# Patient Record
Sex: Male | Born: 1985 | Race: White | Hispanic: No | Marital: Single | Smoking: Current every day smoker
Health system: Southern US, Community
[De-identification: ages and names within clinical notes are randomized; demographics above are authoritative.]

## PROBLEM LIST (undated history)

## (undated) DIAGNOSIS — N200 Calculus of kidney: Secondary | ICD-10-CM

## (undated) HISTORY — PX: FINGER SURGERY: SHX640

---

## 2012-09-25 ENCOUNTER — Encounter (HOSPITAL_COMMUNITY): Payer: Self-pay | Admitting: Pharmacy Technician

## 2012-09-25 ENCOUNTER — Encounter (HOSPITAL_COMMUNITY): Payer: Self-pay | Admitting: *Deleted

## 2012-09-26 ENCOUNTER — Encounter (HOSPITAL_COMMUNITY): Payer: Self-pay | Admitting: Certified Registered Nurse Anesthetist

## 2012-09-26 ENCOUNTER — Encounter (HOSPITAL_COMMUNITY)
Admission: RE | Disposition: A | Discharge status: Home / Self Care | Payer: Self-pay | Source: Ambulatory Visit | Attending: Orthopedic Surgery

## 2012-09-26 ENCOUNTER — Ambulatory Visit (HOSPITAL_COMMUNITY)
Admission: RE | Admit: 2012-09-26 | Discharge: 2012-09-26 | Disposition: A | Discharge status: Home / Self Care | Payer: BC Managed Care – PPO | Source: Ambulatory Visit | Attending: Orthopedic Surgery | Admitting: Orthopedic Surgery

## 2012-09-26 ENCOUNTER — Encounter (HOSPITAL_COMMUNITY): Payer: Self-pay | Admitting: *Deleted

## 2012-09-26 DIAGNOSIS — Z538 Procedure and treatment not carried out for other reasons: Secondary | ICD-10-CM | POA: Insufficient documentation

## 2012-09-26 DIAGNOSIS — S62309A Unspecified fracture of unspecified metacarpal bone, initial encounter for closed fracture: Secondary | ICD-10-CM | POA: Insufficient documentation

## 2012-09-26 DIAGNOSIS — X58XXXA Exposure to other specified factors, initial encounter: Secondary | ICD-10-CM | POA: Insufficient documentation

## 2012-09-26 HISTORY — DX: Calculus of kidney: N20.0

## 2012-09-26 LAB — SURGICAL PCR SCREEN
MRSA, PCR: NEGATIVE
Staphylococcus aureus: POSITIVE — AB

## 2012-09-26 LAB — CBC
MCH: 33.3 pg (ref 26.0–34.0)
Platelets: 218 10*3/uL (ref 150–400)
RBC: 4.65 MIL/uL (ref 4.22–5.81)

## 2012-09-26 SURGERY — CANCELLED PROCEDURE

## 2012-09-26 MED ORDER — MUPIROCIN 2 % EX OINT
TOPICAL_OINTMENT | Freq: Two times a day (BID) | CUTANEOUS | Status: DC
  Filled 2012-09-26: qty 22

## 2012-09-26 SURGICAL SUPPLY — 31 items
APL SKNCLS STERI-STRIP NONHPOA (GAUZE/BANDAGES/DRESSINGS)
BANDAGE ELASTIC 3 VELCRO ST LF (GAUZE/BANDAGES/DRESSINGS) ×3 IMPLANT
BANDAGE ELASTIC 4 VELCRO ST LF (GAUZE/BANDAGES/DRESSINGS) IMPLANT
BANDAGE GAUZE ELAST BULKY 4 IN (GAUZE/BANDAGES/DRESSINGS) ×3 IMPLANT
BENZOIN TINCTURE PRP APPL 2/3 (GAUZE/BANDAGES/DRESSINGS) IMPLANT
BLADE SURG ROTATE 9660 (MISCELLANEOUS) IMPLANT
CLOTH BEACON ORANGE TIMEOUT ST (SAFETY) ×3 IMPLANT
COVER SURGICAL LIGHT HANDLE (MISCELLANEOUS) ×3 IMPLANT
CUFF TOURNIQUET SINGLE 18IN (TOURNIQUET CUFF) IMPLANT
CUFF TOURNIQUET SINGLE 24IN (TOURNIQUET CUFF) IMPLANT
DRSG EMULSION OIL 3X3 NADH (GAUZE/BANDAGES/DRESSINGS) IMPLANT
GAUZE XEROFORM 1X8 LF (GAUZE/BANDAGES/DRESSINGS) IMPLANT
GLOVE BIOGEL PI IND STRL 8.5 (GLOVE) ×2 IMPLANT
GLOVE BIOGEL PI INDICATOR 8.5 (GLOVE) ×1
GLOVE SURG ORTHO 8.0 STRL STRW (GLOVE) ×3 IMPLANT
GOWN STRL NON-REIN LRG LVL3 (GOWN DISPOSABLE) ×6 IMPLANT
KIT BASIN OR (CUSTOM PROCEDURE TRAY) ×3 IMPLANT
KIT ROOM TURNOVER OR (KITS) ×3 IMPLANT
MANIFOLD NEPTUNE II (INSTRUMENTS) ×3 IMPLANT
NS IRRIG 1000ML POUR BTL (IV SOLUTION) ×3 IMPLANT
PACK ORTHO EXTREMITY (CUSTOM PROCEDURE TRAY) ×3 IMPLANT
PAD ARMBOARD 7.5X6 YLW CONV (MISCELLANEOUS) ×6 IMPLANT
SPONGE GAUZE 4X4 12PLY (GAUZE/BANDAGES/DRESSINGS) IMPLANT
STRIP CLOSURE SKIN 1/2X4 (GAUZE/BANDAGES/DRESSINGS) IMPLANT
SUT ETHILON 4 0 P 3 18 (SUTURE) IMPLANT
SUT ETHILON 5 0 P 3 18 (SUTURE)
SUT NYLON ETHILON 5-0 P-3 1X18 (SUTURE) IMPLANT
SUT PROLENE 4 0 P 3 18 (SUTURE) IMPLANT
TOWEL OR 17X24 6PK STRL BLUE (TOWEL DISPOSABLE) ×3 IMPLANT
TOWEL OR 17X26 10 PK STRL BLUE (TOWEL DISPOSABLE) ×3 IMPLANT
WATER STERILE IRR 1000ML POUR (IV SOLUTION) ×3 IMPLANT

## 2012-09-26 NOTE — Preoperative (Signed)
Beta Blockers   Reason not to administer Beta Blockers:Not Applicable 

## 2012-09-26 NOTE — Progress Notes (Signed)
Dr. Melvyn Novas called and spoke with pt.  Pt's surgery is rescheduled for tomorrow afternoon.  Dr. Melvyn Novas to call pt with a time.  Pt instructed to be NPO after midnight.  Instructed to not eat or drink unless Dr. Melvyn Novas tells him otherwise when he calls with his new surgery time.  Pt and friend verbalize understanding and deny further questions.  Pt labwork and PCR are pending.  Pre admit complete.

## 2013-06-11 ENCOUNTER — Other Ambulatory Visit: Payer: Self-pay | Admitting: General Practice

## 2013-06-11 ENCOUNTER — Ambulatory Visit
Admission: RE | Admit: 2013-06-11 | Discharge: 2013-06-11 | Disposition: A | Discharge status: Home / Self Care | Payer: No Typology Code available for payment source | Source: Ambulatory Visit | Attending: General Practice | Admitting: General Practice

## 2013-06-11 DIAGNOSIS — R7611 Nonspecific reaction to tuberculin skin test without active tuberculosis: Secondary | ICD-10-CM

## 2014-02-25 ENCOUNTER — Encounter (HOSPITAL_COMMUNITY): Payer: Self-pay | Admitting: Emergency Medicine

## 2014-02-25 ENCOUNTER — Emergency Department (HOSPITAL_COMMUNITY)
Admission: EM | Admit: 2014-02-25 | Discharge: 2014-02-25 | Disposition: A | Discharge status: Home / Self Care | Payer: Self-pay | Attending: Emergency Medicine | Admitting: Emergency Medicine

## 2014-02-25 ENCOUNTER — Emergency Department (HOSPITAL_COMMUNITY): Payer: Self-pay

## 2014-02-25 DIAGNOSIS — Z79899 Other long term (current) drug therapy: Secondary | ICD-10-CM | POA: Insufficient documentation

## 2014-02-25 DIAGNOSIS — N201 Calculus of ureter: Secondary | ICD-10-CM | POA: Insufficient documentation

## 2014-02-25 DIAGNOSIS — N23 Unspecified renal colic: Secondary | ICD-10-CM | POA: Insufficient documentation

## 2014-02-25 DIAGNOSIS — Z7982 Long term (current) use of aspirin: Secondary | ICD-10-CM | POA: Insufficient documentation

## 2014-02-25 DIAGNOSIS — F17200 Nicotine dependence, unspecified, uncomplicated: Secondary | ICD-10-CM | POA: Insufficient documentation

## 2014-02-25 LAB — URINE MICROSCOPIC-ADD ON

## 2014-02-25 LAB — CBC WITH DIFFERENTIAL/PLATELET
BASOS PCT: 1 % (ref 0–1)
Basophils Absolute: 0 10*3/uL (ref 0.0–0.1)
EOS ABS: 0.2 10*3/uL (ref 0.0–0.7)
EOS PCT: 2 % (ref 0–5)
HCT: 41.6 % (ref 39.0–52.0)
HEMOGLOBIN: 14.5 g/dL (ref 13.0–17.0)
Lymphocytes Relative: 14 % (ref 12–46)
Lymphs Abs: 1.1 10*3/uL (ref 0.7–4.0)
MCH: 33.6 pg (ref 26.0–34.0)
MCHC: 34.9 g/dL (ref 30.0–36.0)
MCV: 96.5 fL (ref 78.0–100.0)
MONOS PCT: 8 % (ref 3–12)
Monocytes Absolute: 0.6 10*3/uL (ref 0.1–1.0)
NEUTROS PCT: 75 % (ref 43–77)
Neutro Abs: 5.9 10*3/uL (ref 1.7–7.7)
Platelets: 205 10*3/uL (ref 150–400)
RBC: 4.31 MIL/uL (ref 4.22–5.81)
RDW: 12 % (ref 11.5–15.5)
WBC: 7.8 10*3/uL (ref 4.0–10.5)

## 2014-02-25 LAB — COMPREHENSIVE METABOLIC PANEL
ALBUMIN: 4 g/dL (ref 3.5–5.2)
ALT: 22 U/L (ref 0–53)
AST: 27 U/L (ref 0–37)
Alkaline Phosphatase: 42 U/L (ref 39–117)
BUN: 11 mg/dL (ref 6–23)
CALCIUM: 9.1 mg/dL (ref 8.4–10.5)
CO2: 24 mEq/L (ref 19–32)
CREATININE: 1.08 mg/dL (ref 0.50–1.35)
Chloride: 105 mEq/L (ref 96–112)
GFR calc non Af Amer: >90 mL/min (ref >90)
GLUCOSE: 93 mg/dL (ref 70–99)
POTASSIUM: 4.1 meq/L (ref 3.7–5.3)
Sodium: 141 mEq/L (ref 137–147)
TOTAL PROTEIN: 6.9 g/dL (ref 6.0–8.3)
Total Bilirubin: 0.5 mg/dL (ref 0.3–1.2)

## 2014-02-25 LAB — URINALYSIS, ROUTINE W REFLEX MICROSCOPIC
Bilirubin Urine: NEGATIVE
GLUCOSE, UA: NEGATIVE mg/dL
Ketones, ur: NEGATIVE mg/dL
NITRITE: NEGATIVE
PH: 7.5 (ref 5.0–8.0)
Protein, ur: NEGATIVE mg/dL
SPECIFIC GRAVITY, URINE: 1.009 (ref 1.005–1.030)
Urobilinogen, UA: 0.2 mg/dL (ref 0.0–1.0)

## 2014-02-25 MED ORDER — IBUPROFEN 400 MG PO TABS
400.0000 mg | ORAL_TABLET | Freq: Once | ORAL | Status: AC
  Administered 2014-02-25: 400 mg via ORAL
  Filled 2014-02-25: qty 1

## 2014-02-25 MED ORDER — ONDANSETRON HCL 4 MG PO TABS: 4.0000 mg | ORAL_TABLET | Freq: Three times a day (TID) | ORAL | Status: DC | PRN

## 2014-02-25 MED ORDER — NAPROXEN 250 MG PO TABS: 250.0000 mg | ORAL_TABLET | Freq: Two times a day (BID) | ORAL | Status: DC

## 2014-02-25 MED ORDER — OXYCODONE-ACETAMINOPHEN 5-325 MG PO TABS: ORAL_TABLET | ORAL | Status: DC

## 2014-02-25 MED ORDER — OXYCODONE-ACETAMINOPHEN 5-325 MG PO TABS
2.0000 | ORAL_TABLET | Freq: Once | ORAL | Status: AC
  Administered 2014-02-25: 2 via ORAL
  Filled 2014-02-25: qty 2

## 2014-02-25 MED ORDER — ONDANSETRON 4 MG PO TBDP
8.0000 mg | ORAL_TABLET | Freq: Once | ORAL | Status: AC
  Administered 2014-02-25: 8 mg via ORAL
  Filled 2014-02-25: qty 2

## 2014-02-25 MED ORDER — TAMSULOSIN HCL 0.4 MG PO CAPS
0.4000 mg | ORAL_CAPSULE | Freq: Every day | ORAL | Status: DC
Start: 2014-02-25 — End: 2015-03-24

## 2014-02-25 NOTE — ED Notes (Signed)
Hes had R sided flank pain since waking this am. He states "it feels like when i had a kidney stone." denies n/v.

## 2014-02-25 NOTE — ED Notes (Signed)
Patient c/o right sided flank pain constant with intermittent flare-ups. Pt sts typical of pt. Frequent kidney stone pain. Patient sts one episode of hematuria and severe dsyuria this morning- pt denies seeing kidney stone. Patient ambulatory without guarding. Pt breathing equal and unlabored. Patient non-tender abdomen- denies n/v/d

## 2014-02-25 NOTE — ED Provider Notes (Signed)
CSN: [id number]     Arrival date & time 02/25/14  1035 History   First MD Initiated Contact with Patient 02/25/14 1220     Chief Complaint  Patient presents with  . Flank Pain      HPI Pt was seen at 1230. Per pt, c/o sudden onset and persistence of waxing and waning right sided flank "pain" that began 5 days ago.  Pt describes the pain as "like my last kidney stone" 5 months ago. Has been associated with multiple intermittent episodes of N/V.  Denies testicular pain/swelling, no dysuria/hematuria, no abd pain, no diarrhea, no black or blood in emesis, no CP/SOB.     Past Medical History  Diagnosis Date  . Kidney stone    Past Surgical History  Procedure Laterality Date  . Finger surgery      Right Index finger- reconnect tendons    History  Substance Use Topics  . Smoking status: Current Every Day Smoker -- 1.00 packs/day for 8 years    Types: Cigarettes  . Smokeless tobacco: Not on file  . Alcohol Use: 1.2 oz/week    2 Cans of beer per week    Review of Systems ROS: Statement: All systems negative except as marked or noted in the HPI; Constitutional: Negative for fever and chills. ; ; Eyes: Negative for eye pain, redness and discharge. ; ; ENMT: Negative for ear pain, hoarseness, nasal congestion, sinus pressure and sore throat. ; ; Cardiovascular: Negative for chest pain, palpitations, diaphoresis, dyspnea and peripheral edema. ; ; Respiratory: Negative for cough, wheezing and stridor. ; ; Gastrointestinal: +N/V. Negative for diarrhea, abdominal pain, blood in stool, hematemesis, jaundice and rectal bleeding. . ; ; Genitourinary: +right flank pain. Negative for dysuria and hematuria. ; ; Genital:  No penile drainage or rash, no testicular pain or swelling, no scrotal rash or swelling.;;  Musculoskeletal: Negative for back pain and neck pain. Negative for swelling and trauma.; ; Skin: Negative for pruritus, rash, abrasions, blisters, bruising and skin lesion.; ; Neuro: Negative  for headache, lightheadedness and neck stiffness. Negative for weakness, altered level of consciousness , altered mental status, extremity weakness, paresthesias, involuntary movement, seizure and syncope.      Allergies  Endocet  Home Medications   Prior to Admission medications   Medication Sig Start Date End Date Taking? Authorizing Provider  aspirin 325 MG tablet Take 925 mg by mouth every 6 (six) hours as needed for moderate pain.   Yes Historical Provider, MD  B Complex-C (B-COMPLEX WITH VITAMIN C) tablet Take 1 tablet by mouth daily.   Yes Historical Provider, MD  oxyCODONE-acetaminophen (PERCOCET) 10-325 MG per tablet Take 1 tablet by mouth every 4 (four) hours as needed for pain.    Yes Historical Provider, MD   BP 127/79  Pulse 63  Temp(Src) 98.1 F (36.7 C) (Oral)  Resp 16  Ht 6\' 6"  (1.981 m)  Wt 220 lb (99.791 kg)  BMI 25.43 kg/m2  SpO2 100% Physical Exam 1235: Physical examination:  Nursing notes reviewed; Vital signs and O2 SAT reviewed;  Constitutional: Well developed, Well nourished, Well hydrated, In no acute distress; Head:  Normocephalic, atraumatic; Eyes: EOMI, PERRL, No scleral icterus; ENMT: Mouth and pharynx normal, Mucous membranes moist; Neck: Supple, Full range of motion, No lymphadenopathy; Cardiovascular: Regular rate and rhythm, No murmur, rub, or gallop; Respiratory: Breath sounds clear & equal bilaterally, No rales, rhonchi, wheezes.  Speaking full sentences with ease, Normal respiratory effort/excursion; Chest: Nontender, Movement normal; Abdomen: Soft, Nontender, Nondistended,  Normal bowel sounds; Genitourinary: No CVA tenderness; Spine:  No midline CS, TS, LS tenderness. +TTP right lumbar paraspinal muscles. No rash;; Extremities: Pulses normal, No tenderness, No edema, No calf edema or asymmetry.; Neuro: AA&Ox3, Major CN grossly intact.  Speech clear. No gross focal motor or sensory deficits in extremities.; Skin: Color normal, Warm, Dry.   ED Course   Procedures     EKG Interpretation None      MDM  MDM Reviewed: previous chart, nursing note and vitals Interpretation: labs and CT scan   Results for orders placed during the hospital encounter of 02/25/14  CBC WITH DIFFERENTIAL      Result Value Ref Range   WBC 7.8  4.0 - 10.5 K/uL   RBC 4.31  4.22 - 5.81 MIL/uL   Hemoglobin 14.5  13.0 - 17.0 g/dL   HCT 16.1  09.6 - 04.5 %   MCV 96.5  78.0 - 100.0 fL   MCH 33.6  26.0 - 34.0 pg   MCHC 34.9  30.0 - 36.0 g/dL   RDW 40.9  81.1 - 91.4 %   Platelets 205  150 - 400 K/uL   Neutrophils Relative % 75  43 - 77 %   Neutro Abs 5.9  1.7 - 7.7 K/uL   Lymphocytes Relative 14  12 - 46 %   Lymphs Abs 1.1  0.7 - 4.0 K/uL   Monocytes Relative 8  3 - 12 %   Monocytes Absolute 0.6  0.1 - 1.0 K/uL   Eosinophils Relative 2  0 - 5 %   Eosinophils Absolute 0.2  0.0 - 0.7 K/uL   Basophils Relative 1  0 - 1 %   Basophils Absolute 0.0  0.0 - 0.1 K/uL  COMPREHENSIVE METABOLIC PANEL      Result Value Ref Range   Sodium 141  137 - 147 mEq/L   Potassium 4.1  3.7 - 5.3 mEq/L   Chloride 105  96 - 112 mEq/L   CO2 24  19 - 32 mEq/L   Glucose, Bld 93  70 - 99 mg/dL   BUN 11  6 - 23 mg/dL   Creatinine, Ser 7.82  0.50 - 1.35 mg/dL   Calcium 9.1  8.4 - 95.6 mg/dL   Total Protein 6.9  6.0 - 8.3 g/dL   Albumin 4.0  3.5 - 5.2 g/dL   AST 27  0 - 37 U/L   ALT 22  0 - 53 U/L   Alkaline Phosphatase 42  39 - 117 U/L   Total Bilirubin 0.5  0.3 - 1.2 mg/dL   GFR calc non Af Amer >90  >90 mL/min   GFR calc Af Amer >90  >90 mL/min  URINALYSIS, ROUTINE W REFLEX MICROSCOPIC      Result Value Ref Range   Color, Urine YELLOW  YELLOW   APPearance CLEAR  CLEAR   Specific Gravity, Urine 1.009  1.005 - 1.030   pH 7.5  5.0 - 8.0   Glucose, UA NEGATIVE  NEGATIVE mg/dL   Hgb urine dipstick LARGE (*) NEGATIVE   Bilirubin Urine NEGATIVE  NEGATIVE   Ketones, ur NEGATIVE  NEGATIVE mg/dL   Protein, ur NEGATIVE  NEGATIVE mg/dL   Urobilinogen, UA 0.2  0.0 - 1.0 mg/dL    Nitrite NEGATIVE  NEGATIVE   Leukocytes, UA TRACE (*) NEGATIVE  URINE MICROSCOPIC-ADD ON      Result Value Ref Range   WBC, UA 0-2  <3 WBC/hpf   RBC / HPF 21-50  <  3 RBC/hpf   Ct Abdomen Pelvis Wo Contrast 02/25/2014   CLINICAL DATA:  Right flank pain; history of urinary tract stones  EXAM: CT ABDOMEN AND PELVIS WITHOUT CONTRAST  TECHNIQUE: Multidetector CT imaging of the abdomen and pelvis was performed following the standard protocol without IV contrast.  COMPARISON:  CT scan of the abdomen and pelvis dated October 12, 2013.  FINDINGS: There is mild right-sided hydronephrosis with mild proximal hydroureter. There is a new 3 mm diameter calcified stone in the right distal ureter just proximal to the ureterovesical junction. This is demonstrated on image 82 of series 2. There are other nonobstructing stones in the right kidney measuring up to 2 mm in diameter. There are nonobstructing stones in the left kidney measuring up to 1 mm in diameter. The partially distended urinary bladder is normal in appearance. The prostate gland and seminal vesicles appear normal.  The liver,pancreas, spleen, partially distended stomach, and adrenal glands are normal in appearance. Punctate calcification in the deep and portion of the gallbladder may reflect tiny calcified stones. The caliber of the abdominal aorta is normal. The unopacified loops of small and large bowel exhibit no evidence of ileus nor of obstruction. A normal calibered, uninflamed-appearing appendix is demonstrated. There is no inguinal nor significant umbilical hernia. The psoas musculature is normal in appearance.  The lumbar vertebral bodies are preserved in height. The bony pelvis exhibits no acute abnormality. The lung bases are clear.  IMPRESSION: 1. There is mild right-sided hydronephrosis and hydroureter secondary to a 3 mm diameter distal ureteral stone. It lies approximately 2 cm proximal to the ureterovesical junction. There are other  nonobstructing stones on the right and on the left. 2. There is no acute visceral abnormality elsewhere. There may be a tiny calcified stones versus sludge within the gallbladder.   Electronically Signed   By: Dayvian  [place]   On: 02/25/2014 12:19    1355:  Pt has tol PO well while in the ED without N/V. VSS. Feels better and wants to go home now. Dx and testing d/w pt and family.  Questions answered.  Verb understanding, agreeable to d/c home with outpt f/u.    Laray AngerKathleen M Porschia Willbanks, DO 02/28/14 2353

## 2014-02-25 NOTE — Discharge Instructions (Signed)
°Emergency Department Resource Guide °1) Find a Doctor and Pay Out of Pocket °Although you won't have to find out who is covered by your insurance plan, it is a good idea to ask around and get recommendations. You will then need to call the office and see if the doctor you have chosen will accept you as a new patient and what types of options they offer for patients who are self-pay. Some doctors offer discounts or will set up payment plans for their patients who do not have insurance, but you will need to ask so you aren't surprised when you get to your appointment. ° °2) Contact Your Local Health Department °Not all health departments have doctors that can see patients for sick visits, but many do, so it is worth a call to see if yours does. If you don't know where your local health department is, you can check in your phone book. The CDC also has a tool to help you locate your state's health department, and many state websites also have listings of all of their local health departments. ° °3) Find a Walk-in Clinic °If your illness is not likely to be very severe or complicated, you may want to try a walk in clinic. These are popping up all over the country in pharmacies, drugstores, and shopping centers. They're usually staffed by nurse practitioners or physician assistants that have been trained to treat common illnesses and complaints. They're usually fairly quick and inexpensive. However, if you have serious medical issues or chronic medical problems, these are probably not your best option. ° °No Primary Care Doctor: °- Call Health Connect at  832-8000 - they can help you locate a primary care doctor that  accepts your insurance, provides certain services, etc. °- Physician Referral Service- 1-800-533-3463 ° °Chronic Pain Problems: °Organization         Address  Phone   Notes  °[facility]  ([phone]) [phone] Patients need to be referred by their primary care doctor.  ° °Medication  Assistance: °Organization         Address  Phone   Notes  °[place] [facility] [address] °[place], [place] [address] (336) 641-8030 --Must be a resident of [place] °-- Must have NO insurance coverage whatsoever (no Medicaid/ Medicare, etc.) °-- The pt. MUST have a primary care doctor that directs their care regularly and follows them in the community °  °[facility]  (866) 331-1348   °[employer]  (888) 892-1162   ° °Agencies that provide inexpensive medical care: °Organization         Address  Phone   Notes  °[facility]  (336) 832-8035   °[facility]    (336) 832-7272   °[facility] [address] °[place], [place] [address] (336) 832-4777   °[facility] [address], °[place] (336) 271-4999   °[facility]    (336) 373-0678   °[facility]    (336) 272-1050   °[facility] ° [address], [place] Phone:  (336) 832-4444, Fax:  (336) 832-4440 Hours of Operation:  9 am - 6 pm, M-F.  Also accepts Medicaid/Medicare and self-pay.  °[facility] ° [address], [place] Phone: (336) [phone], Fax: (336) [phone]. Hours of Operation:  8:30 am - 5:30 pm, M-F.  Also accepts Medicaid and self-pay.  °[facility] [place] [address], [place] Phone: (336) 878-6027   °[facility] [address], [place], [place] (336)723-1848, Ext. 123 Mondays & Thursdays: 7-9 AM.  First 15 patients are seen on a first come, first serve basis. °  ° °Medicaid-accepting [place] Providers: ° °Organization         Address  Phone   Notes  °[facility] [address], [place] (336) [phone] Also accepts self-pay patients.  °[facility] [address], [place] ° (336) 856-9996   °[facility] [address], [place]  (336) 288-8857   °[facility] [address] [place] [address], [place] (336) 299-7000   °Veita Bland [address], [place]  ° (336) 373-1557 Only accepts [place] Access Medicaid patients after they have their name applied to their card.  ° °Self-Pay (no insurance) in [place]: ° °Organization         Address  Phone   Notes  °Sickle Cell Patients, [facility] [address], [place] (336) 832-1970   °[facility] [address], [place] (336) 832-4400   °[facility] ° [address], [place] (336) 992-4800   °[facility]/Dr. Osei-Bonsu ° [address] [place] [address], [place] or [address], [place] (336) [phone] Phone number for both [place] and [place] locations is the same.  °[facility] [address], [place] (336) 299-0000   °[facility] [address] [place] [address], [place] or [address] (336) 852-7530 °(336) 878-2260   °[facility] [address], [place] (336) 350-1642, phone; (336) 294-5005, fax Sees patients 1st and 3rd Saturday of every month.  Must not qualify for public or private insurance (i.e. Medicaid, Medicare, [place] Health Choice, Veterans' Benefits) • Household income should be no more than 200% of the poverty level •The clinic cannot treat you if you are pregnant or think you are pregnant • Sexually transmitted diseases are not treated at the clinic.  ° ° °Dental Care: °Organization         Address  Phone  Notes  °[place] [facility] [address], [place] (336) 641-6152 Accepts children up to age 21 who are enrolled in Medicaid or [place] Health Choice; pregnant women with a Medicaid card; and children who have applied for Medicaid or [place] Health Choice, but were declined, whose parents can pay a reduced fee at time of service.  °[place]  [facility] [place]  [address], [place] (336) 641-7733 Accepts children up to age 21 who are enrolled in Medicaid or [place] Health Choice; pregnant women with a Medicaid card; and children who have applied for Medicaid or [place] Health Choice, but were declined, whose parents can pay a reduced fee at time of service.  °[facility] ° [address], [place] (336) 641-4533 Patients are seen by appointment only. Walk-ins are not accepted. [facility] will see patients 18 years of age and older. °Monday - Tuesday (8am-5pm) °Most Wednesdays (8:30-5pm) °$30 per visit, cash only  °[facility] ° [address], [place] (336) 641-4533 Patients are seen by appointment only. Walk-ins are not accepted. [facility] will see patients 18 years of age and older. °One   Wednesday Evening (Monthly: Volunteer Based).  $30 per visit, cash only  °[facility]  (919) 537-3737 for adults; Children under age 4, call Graduate Pediatric Dentistry at ([phone]) [phone]. Children aged 4-14, please call ([phone]) [phone] to request a pediatric application. ° Dental services are provided in all areas of dental care including fillings, crowns and bridges, complete and partial dentures, implants, gum treatment, root canals, and extractions. Preventive care is also provided. Treatment is provided to both adults and children. °Patients are selected via a lottery and there is often a waiting list. °  °[facility] [address], °[place] ° (336) 763-8833 [email or web address] °  °[facility] [address], [place], [place] (336)723-1848, Ext. 123 Second and Fourth Thursday of each month, opens at 6:30 AM; Clinic ends at 9 AM.  Patients are seen on a first-come first-served basis, and a limited number are seen during each clinic.  ° °[facility] ° [address], [place], [place] (336) 723-7904    Eligibility Requirements °You must have lived in [place], [place], or [place] counties for at least the last three months. °  You cannot be eligible for state or federal sponsored healthcare insurance, including Veterans Administration, Medicaid, or Medicare. °  You generally cannot be eligible for healthcare insurance through your employer.  °  How to apply: °Eligibility screenings are held every Tuesday and Wednesday afternoon from 1:00 pm until 4:00 pm. You do not need an appointment for the interview!  °[facility] [address], [place], [place] 336-631-2330   °[facility]  336-342-8273   °[facility]  336-703-3100   °[facility]  336-570-6415   ° °Behavioral Health Resources in the Community: °Intensive Outpatient Programs °Organization         Address  Phone  Notes  °[facility] [address], [place], [place] 336-878-6098   °[facility] [address], [place], [place] 336-832-9800   °[facility] [address], [place], [place] ° 336-882-2125   °[facility] [address],  °[place], [place] 1-800-853-5163 or 336-641-4981   °Substance Abuse Resources °Organization         Address  Phone  Notes  °[facility]  336-882-2125   °[facility]  336-784-9470   °[facility]  336-285-9073   °[facility]  336-845-3988   °Residential & Outpatient Substance Abuse Program  1-800-659-3381   °Psychological Services °Organization         Address  Phone  Notes  °[facility]  336- 832-9600   °[facility]  336- 378-7881   °[facility] [address], [place] 1-800-853-5163 or 336-641-4981   ° °Mobile Crisis Teams °Organization         Address  Phone  Notes  °[facility], Mobile Crisis Care Unit  1-877-626-1772   °Assertive °[facility] ° [address]  [place], [place] 336-834-9664   °Sharon DeEsch [address] °[place] 336-554-5454   ° °Self-Help/Support Groups °Organization         Address  Phone             Notes  °[facility] of  - variety of support groups  336- 373-1402 Call for more information  °Narcotics Anonymous (NA), [facility] [address], °[place]  2 meetings at this location  ° °  Residential Treatment Programs Organization         Address  Phone  Notes  [facility] [address],    [place]  [phone]   [facility]  [address], [place] [phone], [place], [place] [phone]   [facility] [address], [place] [phone] Admissions: 8am-3pm M-F  [facility] [address]    [place], [place] [phone]   [facility] [address], [place], [place] [phone]   [facility] [address]  [place], [place] [phone]   [facility] Intensive Outpatient [address], [place], [place] [phone]) [phone]   [facility] [address]  [place], [place] [phone] or [phone]   [facility] ([facility]) [address] [place], [place] [phone]) [phone] Accepts Medicaid  [facility] [address]  [place] [phone] Substance Abuse/Addiction Treatment   [facility] Behavioral [facility] Resources Organization         Address  Phone  Notes  [facility]  [phone](888) (424)[phone]   Angie FavaJulie Brannon, PhD [address], [address] [place], [place]   [phone](336) (205) [phone] or [phone](336) [phone]   [facility]   [address] [place], [place] [phone](336) [phone]   [facility] [address], [place], [place] [phone](336) [phone] Insurance/Medicaid/sponsorship through [facility]  [facility] [address]                                    [place], [place] [phone]) [phone] Therapy/tele-psych/case    [facility] [address]   [place], [place] [phone](336) [phone]    Dr. Lolly MustacheArfeen  [phone]) [phone]   [facility]  [employer] [facility] 1) [address] [place] [address], [place] 2) [address], [place] 3)  [address], [place] [phone](336) [phone](336) [phone]  [phone](336) [phone]   [facility] Child Abuse Hotline [phone](336) [phone] or ([phone](336) [phone] (After Hours)       Take the prescriptions as directed.  Call the Urologist today to schedule a follow up appointment within the next week.  Return to the Emergency Department immediately if worsening.

## 2014-02-26 NOTE — Discharge Planning (Signed)
[facility] Community Liaison: Spoke to patient about establishing primary care. Pt states his employer does offer insurance but hasn't had a chance to sign up due to seasonal employment. Patient was given a Facilities managerresource guide and my contact information for any future questions or concerns.

## 2014-10-10 IMAGING — CR DG CHEST 2V
2 series · 2 of 2 positions shown · non-contrast
Comparison: None.

CLINICAL DATA: Positive tuberculin skin test

CHEST - 2 VIEW

[w chest pa]
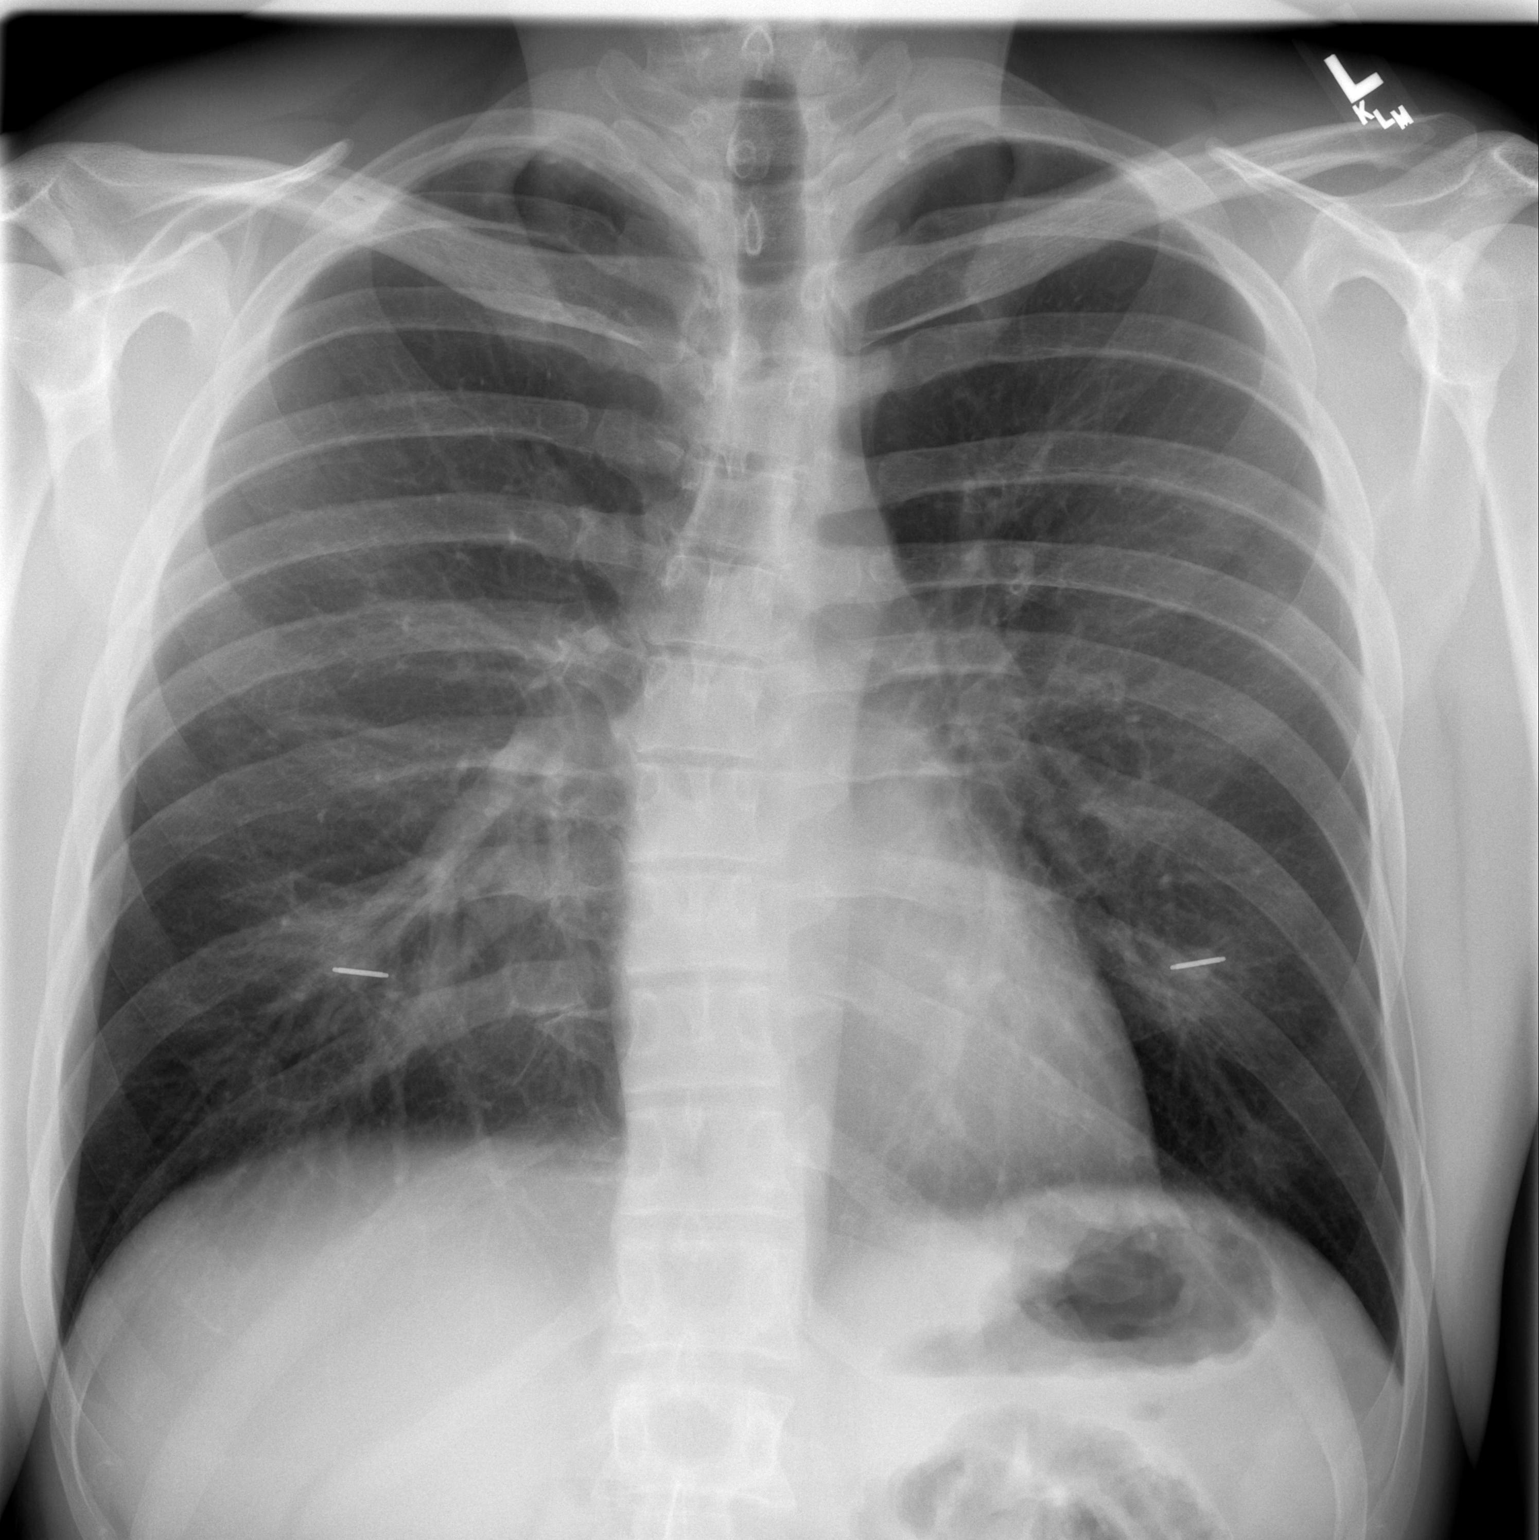

[w chest lat]
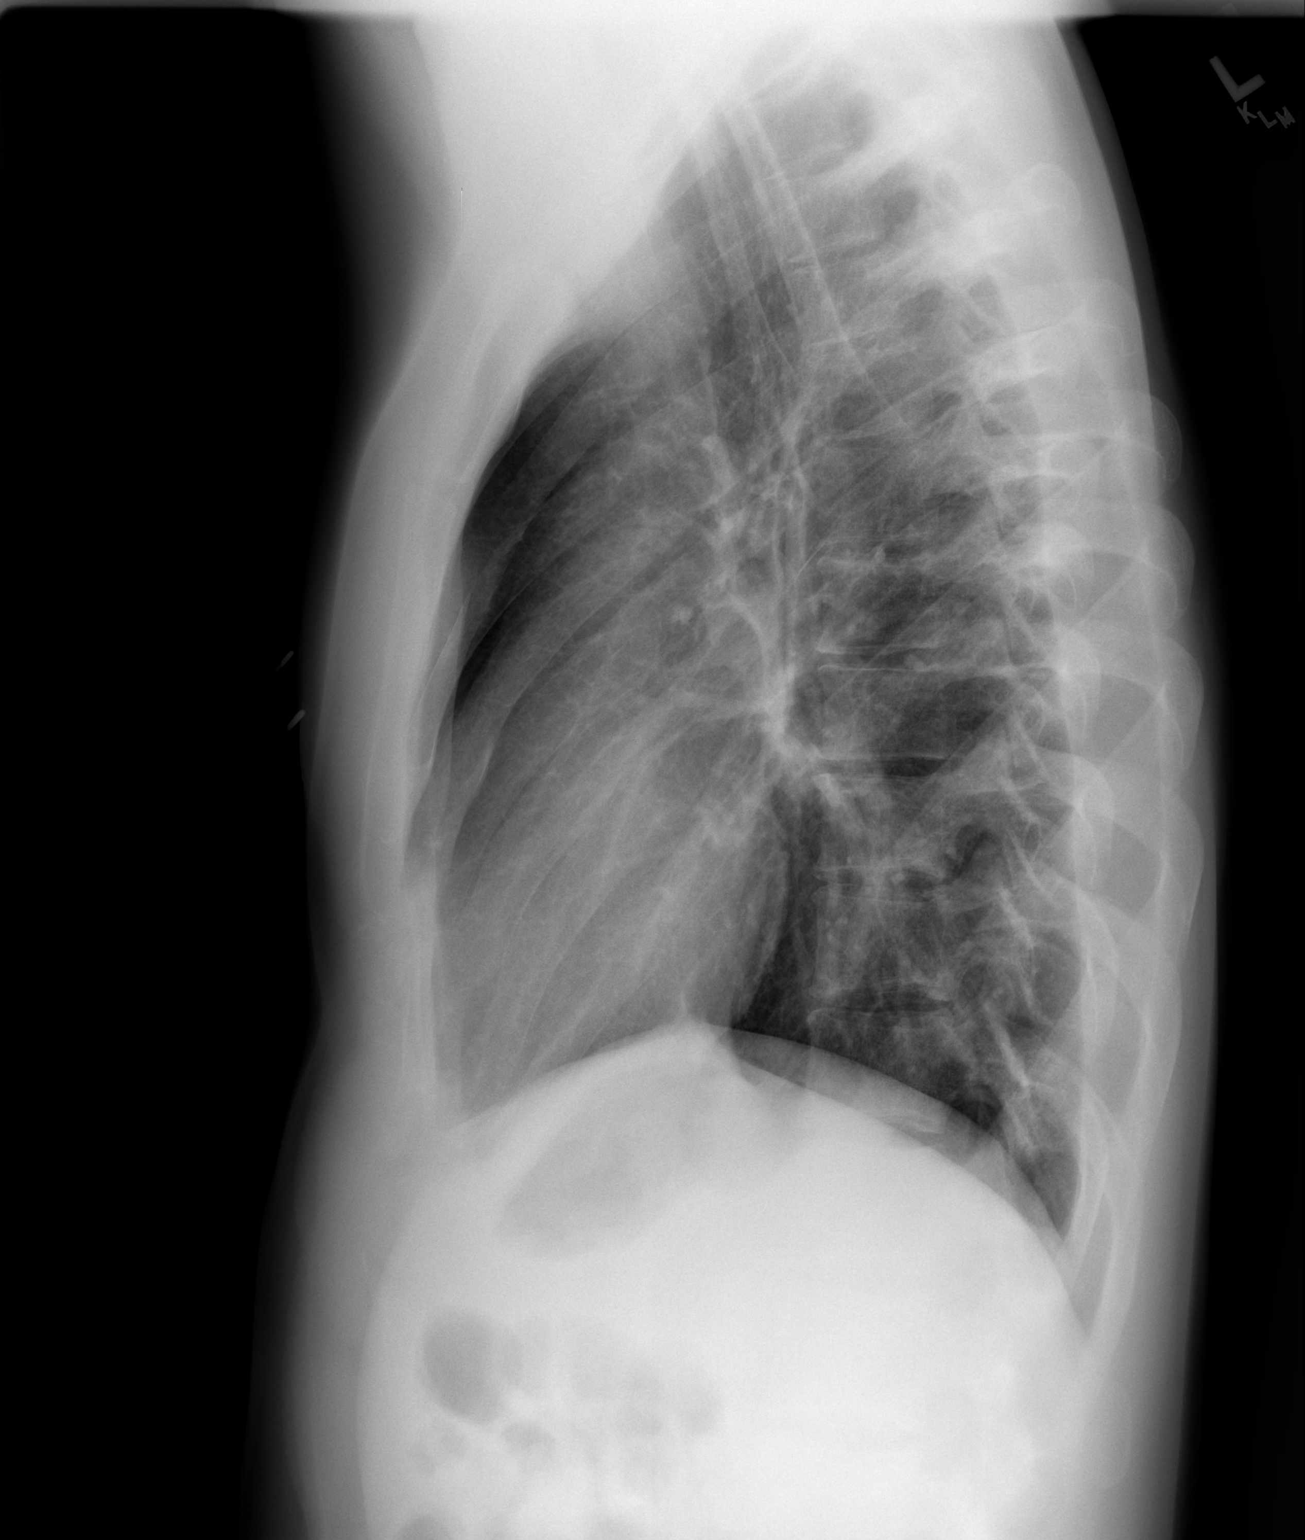

[2 of 2 positions shown; findings below may reference images not displayed]

FINDINGS: Lungs clear.  Heart size and pulmonary vascularity are
normal.  No adenopathy.

There is mid thoracic dextroscoliosis.
IMPRESSION: Scoliosis.  No adenopathy.  Lungs clear.

## 2014-10-23 ENCOUNTER — Encounter (HOSPITAL_COMMUNITY): Payer: Self-pay | Admitting: Orthopedic Surgery

## 2015-02-25 ENCOUNTER — Encounter (HOSPITAL_BASED_OUTPATIENT_CLINIC_OR_DEPARTMENT_OTHER): Payer: Self-pay

## 2015-02-25 ENCOUNTER — Emergency Department (HOSPITAL_BASED_OUTPATIENT_CLINIC_OR_DEPARTMENT_OTHER)
Admission: EM | Admit: 2015-02-25 | Discharge: 2015-02-25 | Disposition: A | Discharge status: Home / Self Care | Payer: Self-pay | Attending: Emergency Medicine | Admitting: Emergency Medicine

## 2015-02-25 DIAGNOSIS — Z87442 Personal history of urinary calculi: Secondary | ICD-10-CM | POA: Insufficient documentation

## 2015-02-25 DIAGNOSIS — Z7982 Long term (current) use of aspirin: Secondary | ICD-10-CM | POA: Insufficient documentation

## 2015-02-25 DIAGNOSIS — Z79899 Other long term (current) drug therapy: Secondary | ICD-10-CM | POA: Insufficient documentation

## 2015-02-25 DIAGNOSIS — K439 Ventral hernia without obstruction or gangrene: Secondary | ICD-10-CM | POA: Insufficient documentation

## 2015-02-25 DIAGNOSIS — Z720 Tobacco use: Secondary | ICD-10-CM | POA: Insufficient documentation

## 2015-02-25 DIAGNOSIS — Z791 Long term (current) use of non-steroidal anti-inflammatories (NSAID): Secondary | ICD-10-CM | POA: Insufficient documentation

## 2015-02-25 NOTE — ED Notes (Signed)
Pt reports periumbilical pain x 3 days. Denies n/v/d.

## 2015-02-25 NOTE — ED Provider Notes (Signed)
CSN: [id number]     Arrival date & time 02/25/15  1337 History   First MD Initiated Contact with Patient 02/25/15 1348     Chief Complaint  Patient presents with  . Abdominal Pain     (Consider location/radiation/quality/duration/timing/severity/associated sxs/prior Treatment) HPI Comments: Patient is a 29 year old male with history of kidney stones. He presents for evaluation of periumbilical abdominal pain. States that one week ago he was coughing and sneezing, then noted a bulging area above his belly button. It became painful but he was able to push the area and make it go away and feel better. This has recurred several times since last week. He denies any vomiting. He denies any fever. He denies any bowel or bladder issues.  Patient is a 29 y.o. male presenting with abdominal pain. The history is provided by the patient.  Abdominal Pain Pain location:  Periumbilical Pain quality: sharp   Pain radiates to:  Does not radiate Pain severity:  Moderate Onset quality:  Sudden Duration:  1 week Timing:  Intermittent Progression:  Worsening Chronicity:  New Relieved by:  Nothing Worsened by:  Nothing tried   Past Medical History  Diagnosis Date  . Kidney stone    Past Surgical History  Procedure Laterality Date  . Finger surgery      Right Index finger- reconnect tendons   No family history on file. History  Substance Use Topics  . Smoking status: Current Every Day Smoker -- 1.00 packs/day for 8 years    Types: Cigarettes  . Smokeless tobacco: Not on file  . Alcohol Use: 1.2 oz/week    2 Cans of beer per week    Review of Systems  Gastrointestinal: Positive for abdominal pain.  All other systems reviewed and are negative.     Allergies  Endocet  Home Medications   Prior to Admission medications   Medication Sig Start Date End Date Taking? Authorizing Provider  aspirin 325 MG tablet Take 925 mg by mouth every 6 (six) hours as needed for moderate pain.     Historical Provider, MD  B Complex-C (B-COMPLEX WITH VITAMIN C) tablet Take 1 tablet by mouth daily.    Historical Provider, MD  naproxen (NAPROSYN) 250 MG tablet Take 1 tablet (250 mg total) by mouth 2 (two) times daily with a meal. 02/25/14   Samuel JesterKathleen McManus, DO  ondansetron (ZOFRAN) 4 MG tablet Take 1 tablet (4 mg total) by mouth every 8 (eight) hours as needed for nausea or vomiting. 02/25/14   Samuel JesterKathleen McManus, DO  oxyCODONE-acetaminophen (PERCOCET) 10-325 MG per tablet Take 1 tablet by mouth every 4 (four) hours as needed for pain.     Historical Provider, MD  oxyCODONE-acetaminophen (PERCOCET/ROXICET) 5-325 MG per tablet 1 or 2 tabs PO q6h prn pain 02/25/14   Samuel JesterKathleen McManus, DO  tamsulosin (FLOMAX) 0.4 MG CAPS capsule Take 1 capsule (0.4 mg total) by mouth at bedtime. 02/25/14   Samuel JesterKathleen McManus, DO   BP 123/81 mmHg  Pulse 82  Temp(Src) 97.3 F (36.3 C) (Oral)  Resp 16  Ht 6\' 6"  (1.981 m)  Wt 230 lb (104.327 kg)  BMI 26.58 kg/m2  SpO2 99% Physical Exam  Constitutional: He is oriented to person, place, and time. He appears well-developed and well-nourished. No distress.  HENT:  Head: Normocephalic and atraumatic.  Neck: Normal range of motion. Neck supple.  Abdominal: Soft. Bowel sounds are normal. He exhibits no distension. There is tenderness.  There is mild tenderness to palpation just superior to the  umbilicus. There is what appears to be an abdominal wall defect in this area, however no protruding bowel or mesentery.  Musculoskeletal: Normal range of motion. He exhibits no edema.  Neurological: He is alert and oriented to person, place, and time.  Skin: Skin is warm and dry. He is not diaphoretic.  Nursing note and vitals reviewed.   ED Course  Procedures (including critical care time) Labs Review Labs Reviewed  URINALYSIS, ROUTINE W REFLEX MICROSCOPIC    Imaging Review No results found.   EKG Interpretation None      MDM   Final diagnoses:  None     Patient presents here with complaints of abdominal pain and what sounds like a ventral hernia. He is currently pain-free and only experiences the discomfort when the area is bulging through. I see nothing emergent, but do feel as though the patient will require follow-up with general surgery. He will be given the [place] to arrange this appointment. He understands to return if his pain worsens, he develops high fever, bloody stool, or other new and concerning symptoms.    Geoffery Lyonsouglas Mak Bonny, MD 02/25/15 [phone]

## 2015-02-25 NOTE — Discharge Instructions (Signed)
[facility]. The contact information has been provided in this discharge summary.  Return to the emergency department if your pain or swelling worsens, or if you develop fever, vomiting, or other new and concerning symptoms.   Hernia A hernia occurs when an internal organ pushes out through a weak spot in the abdominal wall. Hernias most commonly occur in the groin and around the navel. Hernias often can be pushed back into place (reduced). Most hernias tend to get worse over time. Some abdominal hernias can get stuck in the opening (irreducible or incarcerated hernia) and cannot be reduced. An irreducible abdominal hernia which is tightly squeezed into the opening is at risk for impaired blood supply (strangulated hernia). A strangulated hernia is a medical emergency. Because of the risk for an irreducible or strangulated hernia, surgery may be recommended to repair a hernia. CAUSES   Heavy lifting.  Prolonged coughing.  Straining to have a bowel movement.  A cut (incision) made during an abdominal surgery. HOME CARE INSTRUCTIONS   Bed rest is not required. You may continue your normal activities.  Avoid lifting more than 10 pounds (4.5 kg) or straining.  Cough gently. If you are a smoker it is best to stop. Even the best hernia repair can break down with the continual strain of coughing. Even if you do not have your hernia repaired, a cough will continue to aggravate the problem.  Do not wear anything tight over your hernia. Do not try to keep it in with an outside bandage or truss. These can damage abdominal contents if they are trapped within the hernia sac.  Eat a normal diet.  Avoid constipation. Straining over long periods of time will increase hernia size and encourage breakdown of repairs. If you cannot do this with diet alone, stool softeners may be used. SEEK IMMEDIATE MEDICAL CARE IF:   You have a fever.  You develop  increasing abdominal pain.  You feel nauseous or vomit.  Your hernia is stuck outside the abdomen, looks discolored, feels hard, or is tender.  You have any changes in your bowel habits or in the hernia that are unusual for you.  You have increased pain or swelling around the hernia.  You cannot push the hernia back in place by applying gentle pressure while lying down. MAKE SURE YOU:   Understand these instructions.  Will watch your condition.  Will get help right away if you are not doing well or get worse. Document Released: 09/26/2005 Document Revised: 12/19/2011 Document Reviewed: 05/15/2008 [facility] Patient Information 2015 [employer] [place]. This information is not intended to replace advice given to you by your health care provider. Make sure you discuss any questions you have with your health care provider.

## 2015-03-24 ENCOUNTER — Encounter (HOSPITAL_BASED_OUTPATIENT_CLINIC_OR_DEPARTMENT_OTHER): Payer: Self-pay | Admitting: *Deleted

## 2015-03-24 ENCOUNTER — Emergency Department (HOSPITAL_BASED_OUTPATIENT_CLINIC_OR_DEPARTMENT_OTHER)
Admission: EM | Admit: 2015-03-24 | Discharge: 2015-03-24 | Disposition: A | Discharge status: Home / Self Care | Payer: Self-pay | Attending: Emergency Medicine | Admitting: Emergency Medicine

## 2015-03-24 DIAGNOSIS — Z7982 Long term (current) use of aspirin: Secondary | ICD-10-CM | POA: Insufficient documentation

## 2015-03-24 DIAGNOSIS — L237 Allergic contact dermatitis due to plants, except food: Secondary | ICD-10-CM | POA: Insufficient documentation

## 2015-03-24 DIAGNOSIS — Z720 Tobacco use: Secondary | ICD-10-CM | POA: Insufficient documentation

## 2015-03-24 DIAGNOSIS — Z79899 Other long term (current) drug therapy: Secondary | ICD-10-CM | POA: Insufficient documentation

## 2015-03-24 DIAGNOSIS — Z87442 Personal history of urinary calculi: Secondary | ICD-10-CM | POA: Insufficient documentation

## 2015-03-24 MED ORDER — PREDNISONE 20 MG PO TABS: ORAL_TABLET | ORAL | Status: AC

## 2015-03-24 NOTE — ED Notes (Signed)
Pt c/o poison ivy to bil extr and face x 3 days

## 2015-03-24 NOTE — ED Notes (Signed)
PA at bedside.

## 2015-03-24 NOTE — ED Provider Notes (Signed)
CSN: [id number]     Arrival date & time 03/24/15  1749 History   First MD Initiated Contact with Patient 03/24/15 1844     Chief Complaint  Patient presents with  . Poison Ivy     (Consider location/radiation/quality/duration/timing/severity/associated sxs/prior Treatment) The history is provided by the patient. No language interpreter was used.  Jaime Santiago is a 29 y/o M with past medical history of kidney stone presenting to the ED with poison ivy exposure last week. Patient reports he broke around her rash last week and states that it just continues to spread. Reported that is on his hands bilaterally, left upper eyelid, abdomen and legs bilaterally. States he's been placing calamine lotion on the areas without relief. States that he has been Having continuous scratching has been unable to control the scratching. 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 Denied fever, chills, tongue swelling, throat closing sensation, difficulty swallowing, headache, blurred vision, sudden loss of vision, chest pain, shortness of breath, difficulty breathing, facial swelling, fainting. PCP none  Past Medical History  Diagnosis Date  . Kidney stone    Past Surgical History  Procedure Laterality Date  . Finger surgery      Right Index finger- reconnect tendons   History reviewed. No pertinent family history. History  Substance Use Topics  . Smoking status: Current Every Day Smoker -- 1.00 packs/day for 8 years    Types: Cigarettes  . Smokeless tobacco: Not on file  . Alcohol Use: 1.2 oz/week    2 Cans of beer per week    Review of Systems  Constitutional: Negative for fever and chills.  Eyes: Negative for visual disturbance.  Respiratory: Negative for shortness of breath.   Cardiovascular: Negative for chest pain.  Skin: Positive for rash.  Neurological: Negative for dizziness, syncope, weakness, numbness and headaches.      Allergies  Endocet  Home  Medications   Prior to Admission medications   Medication Sig Start Date End Date Taking? Authorizing Provider  aspirin 325 MG tablet Take 925 mg by mouth every 6 (six) hours as needed for moderate pain.    Historical Provider, MD  B Complex-C (B-COMPLEX WITH VITAMIN C) tablet Take 1 tablet by mouth daily.    Historical Provider, MD  oxyCODONE-acetaminophen (PERCOCET) 10-325 MG per tablet Take 1 tablet by mouth every 4 (four) hours as needed for pain.     Historical Provider, MD  predniSONE (DELTASONE) 20 MG tablet 3 tabs po daily x 3 days, then 2 tabs x 3 days, then 1.5 tabs x 3 days, then 1 tab x 3 days, then 0.5 tabs x 3 days 03/24/15   Tawny Raspberry, PA-C   BP 151/81 mmHg  Pulse 54  Temp(Src) 98.2 F (36.8 C) (Oral)  Resp 16  Ht 6\' 6"  (1.981 m)  Wt 220 lb (99.791 kg)  BMI 25.43 kg/m2  SpO2 100% Physical Exam  Constitutional: He is oriented to person, place, and time. He appears well-developed and well-nourished. No distress.  HENT:  Head: Normocephalic and atraumatic.  Negative tongue swelling Negative facial swelling  Eyes: Conjunctivae and EOM are normal. Right eye exhibits no discharge. Left eye exhibits no discharge.  Neck: Normal range of motion. Neck supple.  Cardiovascular: Normal rate, regular rhythm and normal heart sounds.   Pulmonary/Chest: Effort normal and breath sounds normal. No respiratory distress. He has no wheezes. He has no rales.  Patient is able to speak in full sentences that difficulty Negative  use of accessory muscles Negative stridor  Musculoskeletal: Normal range of motion.  Lymphadenopathy:    He has no cervical adenopathy.  Neurological: He is alert and oriented to person, place, and time. No cranial nerve deficit. He exhibits normal muscle tone. Coordination normal.  Skin: Rash noted. He is not diaphoretic.  Poison ivy appearing rash identified to the hands bilaterally, forearms bilaterally, right side of the abdomen, upper thighs bilaterally.  Negative weeping a red streaks. Patient actively itching during examination.  Nursing note and vitals reviewed.   ED Course  Procedures (including critical care time) Labs Review Labs Reviewed - No data to display  Imaging Review No results found.   EKG Interpretation None      MDM   Final diagnoses:  Poison ivy    Medications - No data to display  Filed Vitals:   03/24/15 1752  BP: 151/81  Pulse: 54  Temp: 98.2 F (36.8 C)  TempSrc: Oral  Resp: 16  Height: 6\' 6"  (1.981 m)  Weight: 220 lb (99.791 kg)  SpO2: 100%   Doubt shingles. Doubt SJS. Doubt erythema multiforme major and minor. Patient presented to the ED with poison ivy rash. Patient stable, afebrile. Patient not septic appearing. Negative signs of respiratory distress. Discharged patient. Discharge patient with steroids. [facility] Discussed with patient to closely monitor symptoms and if symptoms are to worsen or change to report back to the ED - strict return instructions given.  Patient agreed to plan of care, understood, all questions answered.   Jamse Mead, PA-C 03/24/15 2017  Virgel Manifold, MD 03/25/15 1452

## 2015-03-24 NOTE — Discharge Instructions (Signed)
Please call your doctor for a followup appointment within 24-48 hours. When you talk to your doctor please let them know that you were seen in the emergency department and have them acquire all of your records so that they can discuss the findings with you and formulate a treatment plan to fully care for your new and ongoing problems. Please follow-up with health and wellness Center Please take medications as prescribed Please continue to monitor symptoms closely and if symptoms are to worsen or change (fever greater than 101, chills, sweating, nausea, vomiting, chest pain, shortness of breathe, difficulty breathing, weakness, numbness, tingling, worsening or changes to pain pattern, ulcers, active drainage or bleeding, pus drainage, throat closing sensation, tongue swelling, difficulty breathing, wheezes or stridor) please report back to the Emergency Department immediately.   Poison [employer] ivy is a inflammation of the skin (contact dermatitis) caused by touching the allergens on the leaves of the ivy plant following previous exposure to the plant. The rash usually appears 48 hours after exposure. The rash is usually bumps (papules) or blisters (vesicles) in a linear pattern. Depending on your own sensitivity, the rash may simply cause redness and itching, or it may also progress to blisters which may break open. These must be well cared for to prevent secondary bacterial (germ) infection, followed by scarring. Keep any open areas dry, clean, dressed, and covered with an antibacterial ointment if needed. The eyes may also get puffy. The puffiness is worst in the morning and gets better as the day progresses. This dermatitis usually heals without scarring, within 2 to 3 weeks without treatment. HOME CARE INSTRUCTIONS  Thoroughly wash with soap and water as soon as you have been exposed to poison ivy. You have about one half hour to remove the plant resin before it will cause the rash. This washing will  destroy the oil or antigen on the skin that is causing, or will cause, the rash. Be sure to wash under your fingernails as any plant resin there will continue to spread the rash. Do not rub skin vigorously when washing affected area. Poison ivy cannot spread if no oil from the plant remains on your body. A rash that has progressed to weeping sores will not spread the rash unless you have not washed thoroughly. It is also important to wash any clothes you have been wearing as these may carry active allergens. The rash will return if you wear the unwashed clothing, even several days later. Avoidance of the plant in the future is the best measure. Poison ivy plant can be recognized by the number of leaves. Generally, poison ivy has three leaves with flowering branches on a single stem. Diphenhydramine may be purchased over the counter and used as needed for itching. Do not drive with this medication if it makes you drowsy.Ask your caregiver about medication for children. SEEK MEDICAL CARE IF:  Open sores develop.  Redness spreads beyond area of rash.  You notice purulent (pus-like) discharge.  You have increased pain.  Other signs of infection develop (such as fever). Document Released: 09/23/2000 Document Revised: 12/19/2011 Document Reviewed: 03/06/2009 [facility] Patient Information 2015 [place], [place]. This information is not intended to replace advice given to you by your health care provider. Make sure you discuss any questions you have with your health care provider.   Emergency Department Resource Guide 1) Find a Doctor and Pay Out of Pocket Although you won't have to find out who is covered by your insurance plan, it is a  good idea to ask around and get recommendations. You will then need to call the office and see if the doctor you have chosen will accept you as a new patient and what types of options they offer for patients who are self-pay. Some doctors offer discounts or will set up  payment plans for their patients who do not have insurance, but you will need to ask so you aren't surprised when you get to your appointment.  2) Contact Your Local Health Department Not all health departments have doctors that can see patients for sick visits, but many do, so it is worth a call to see if yours does. If you don't know where your local health department is, you can check in your phone book. The CDC also has a tool to help you locate your state's health department, and many state websites also have listings of all of their local health departments.  3) Find a Walk-in Clinic If your illness is not likely to be very severe or complicated, you may want to try a walk in clinic. These are popping up all over the country in pharmacies, drugstores, and shopping centers. They're usually staffed by nurse practitioners or physician assistants that have been trained to treat common illnesses and complaints. They're usually fairly quick and inexpensive. However, if you have serious medical issues or chronic medical problems, these are probably not your best option.  No Primary Care Doctor: - Call Health Connect at  [phone] - they can help you locate a primary care doctor that  accepts your insurance, provides certain services, etc. - Physician Referral Service- [phone]  Chronic Pain Problems: Organization         Address  Phone   Notes  [facility]  [phone] Patients need to be referred by their primary care doctor.   Medication Assistance: Organization         Address  Phone   Notes  [facility] Medication [facility] [address] [place], [place] [address] [phone] --Must be a resident of [facility] -- Must have NO insurance coverage whatsoever (no Medicaid/ Medicare, etc.) -- The pt. MUST have a primary care doctor that directs their care regularly and follows them in the community   [facility]  [phone]   [employer]  [phone]    Agencies that provide inexpensive medical care: Organization         Address  Phone   Notes  [facility]  [phone]   [facility]    [phone]   [facility] [address] [place], [place] [address] [phone]   [facility] [address] [place] [phone]   Planned Parenthood    [phone]   [facility]    [phone]   [facility]  [address], [place] Phone:  [phone], Fax:  [phone] Hours of Operation:  9 am - 6 pm, M-F.  Also accepts Medicaid/Medicare and self-pay.  [facility]  [address], [place] Phone: [phone], Fax: [phone]. Hours of Operation:  8:30 am - 5:30 pm, M-F.  Also accepts Medicaid and self-pay.  [facility] [place] [address], [place] Phone: [phone]   [facility] [address] Natasha Bence [place], [place] [phone], Ext. [phone] Mondays & Thursdays: 7-9 AM.  First  15 patients are seen on a first come, first serve basis.    Medicaid-accepting [facility] Providers:  Organization         Address  Phone   Notes  [facility] [address], [place] [phone] Also accepts self-pay patients.  [facility] [address] Laurell Josephs [place], [place]  [phone]   [facility] [address], [place] [phone]   [facility] [address] [place] [phone]   Renaye Rakers [address], [place]   [phone] Only accepts [place] Access [place] patients after they have their name applied to their card.   Self-Pay (no insurance) in [facility]:  Organization         Address  Phone   Notes  Sickle Cell Patients, [facility] [address] [place], [place]  [phone]   [facility] [address] [place], [place] [phone]   [facility] [place]  [address], [place] [phone]   [facility]/Dr. Osei-Bonsu  [address], [place] or [address], [place] [phone] Phone number for both [place] and [place] locations is the same.  [facility] [address] [place] [phone]   [facility] [address], [place] or [address] [phone]   [facility] [address], [place] [phone], phone; [phone], fax Sees patients 1st and 3rd Saturday of every month.  Must not qualify for public or private insurance (i.e. Medicaid, Medicare, [place] Health Choice, Veterans' Benefits)  Household income should be no more than 200% of the poverty level The clinic cannot treat you if you are pregnant or think you are pregnant  Sexually transmitted diseases are not treated at the clinic.    Dental Care: Organization         Address  Phone  Notes  [facility] of [facility] [address] [place], [place] [phone] Accepts children up to age 71 who are enrolled in [place] or [place] Health Choice; pregnant women with a Medicaid card; and children who have applied for Medicaid or [place] Health Choice, but were declined, whose parents can pay a reduced fee at time of service.  [facility] Department of [facility]  [address], [place] [phone] Accepts children up to age 19 who are enrolled in [place] or [place] Health Choice; pregnant women with a Medicaid card; and children who have applied for Medicaid or [place] Health Choice, but were declined, whose parents can pay a reduced fee at time of service.  [facility]  [address] [place], [place] [phone] Patients  are seen by appointment only. Walk-ins are not accepted. [facility] will see patients 92 years of age and older. Monday - Tuesday (8am-5pm) Most Wednesdays (8:30-5pm) $30 per visit, cash only  [facility] Adult Dental Access PRO[facility]  [address], [facility] [phone] Patients are seen by appointment only. Walk-ins are not accepted. [facility] will see patients 9 years of age and older. One Wednesday Evening (Monthly: Volunteer Based).  $30 per visit, cash only  [employer]  [phone] for adults; Children under age 41, call Graduate Pediatric  Dentistry at [phone]. Children aged 22-14, please call [phone] to request a pediatric application.  Dental services are provided in all areas of dental care including fillings, crowns and bridges, complete and partial dentures, implants, gum treatment, root canals, and extractions. Preventive care is also provided. Treatment is provided to both adults and children. Patients are selected via a lottery and there is often a waiting list.   [facility] [address], [place]  [phone] [email or web address]   [facility] [address] [place], [place] [phone], Ext. [phone] Second and Fourth Thursday of each month, opens at 6:30 AM; Clinic ends at 9 AM.  Patients are seen on a first-come first-served basis, and a limited number are seen during each clinic.   [facility]  [address] Ether Griffins [place], [place] [phone]   Eligibility Requirements You must have lived in [place], [place], or [place] counties for at least the last three months.   You cannot be eligible for state or federal sponsored [place], including [employer], [place], or [employer].   You generally cannot be eligible for healthcare insurance through your employer.    How to apply: Eligibility screenings are held every Tuesday and Wednesday afternoon from 1:00 pm until  4:00 pm. You do not need an appointment for the interview!  [facility] [address] [place], [place] [phone]   [facility] Health Department  [phone]   [facility] Health Department  [phone]   [facility] Health Department  [phone]    Behavioral Health Resources in the Community: Intensive Outpatient Programs Organization         Address  Phone  Notes  [facility] [address], [place], [place] [phone]   [facility] Outpatient [address], [place], [place] [phone]   [facility] [address], [place], [place]  [phone]   [facility] Mental Health [address]  [place], [place] [phone] or [phone]   Substance Abuse Resources Organization         Address  Phone  Notes  [facility]  [phone]   [facility]  [phone]   [facility]  [phone]   Floydene Flock  [phone]   Residential & Outpatient Substance Abuse Program  [phone]   Psychological Services Organization         Address  Phone  Notes  [facility] Health  [phone]   [facility]  [phone]   [facility] Mental Health [address], [place] [phone] or [phone]    Mobile Crisis Teams Organization         Address  Phone  Notes  [facility], Mobile Crisis Care Unit  [phone]   [facility]  [address] [place], [place] [phone]   Doristine Locks [address] [place] [phone]    Self-Help/Support Groups Organization         Address  Phone             Notes  [facility] [place] - variety of support groups  336- I7437963 Call for more information  Narcotics Anonymous (NA), [facility] [address], [employer] [place]  2 meetings at this location   Statistician          Address  Phone  Notes  [facility] [address],  [place]  [phone]   [facility]  [address] [place] [address], [place], [place] [phone]   [facility] [address] [place], [place] [phone] Admissions: 8am-3pm M-F  [facility] [address],    [place], [place] [phone]   [facility] [address] [place], [place], [place] [phone]   [facility] [address]  [place], [place] [phone]   [facility] - Intensive Outpatient [address] Laurell Josephs [address], [place], [place] [phone]   [facility] (Addiction [facility] [address] [place]  [place], [place] [phone] or [phone]   [facility] ([facility]) [address] [place], [place] [phone] Accepts Medicaid  [facility] [address]  [place] [phone] Substance Abuse/Addiction Treatment   [facility] Organization         Address  Phone  Notes  [facility]  [phone]   Angie Fava, PhD [address] Ervin Knack [place], [place]   [phone] or [phone]   [facility] Behavioral   [address] [place], [place] [phone]   [facility] [address], [place], [place] [phone] Insurance/Medicaid/sponsorship through [facility] and Families [address]                                    [place], [place] [phone] Therapy/tele-psych/case  [facility] [address], [place] [phone]    Dr. Lolly Mustache  [phone]   [facility]  [employer] [facility] Dept. 1) [address] [place] 2) [address] [place] 3)  [address], [place] [phone]  [phone]   [facility] Child Abuse Hotline (706)039-7651 or [phone] (After Hours)

## 2015-07-11 DEATH — deceased
